# Patient Record
Sex: Male | Born: 1935 | Race: White | Hispanic: No | Marital: Married | Smoking: Former smoker
Health system: Southern US, Community
[De-identification: ages and names within clinical notes are randomized; demographics above are authoritative.]

## PROBLEM LIST (undated history)

## (undated) DIAGNOSIS — J449 Chronic obstructive pulmonary disease, unspecified: Secondary | ICD-10-CM

---

## 2015-03-19 ENCOUNTER — Emergency Department (HOSPITAL_COMMUNITY): Payer: Medicare Other

## 2015-03-19 ENCOUNTER — Inpatient Hospital Stay (HOSPITAL_COMMUNITY)
Admission: EM | Admit: 2015-03-19 | Discharge: 2015-03-20 | DRG: 193 | Disposition: A | Discharge status: Home / Self Care | Payer: Medicare Other | Attending: Internal Medicine | Admitting: Internal Medicine

## 2015-03-19 ENCOUNTER — Encounter (HOSPITAL_COMMUNITY): Payer: Self-pay | Admitting: Emergency Medicine

## 2015-03-19 ENCOUNTER — Inpatient Hospital Stay (HOSPITAL_COMMUNITY): Payer: Medicare Other

## 2015-03-19 ENCOUNTER — Other Ambulatory Visit (HOSPITAL_COMMUNITY): Payer: Self-pay

## 2015-03-19 DIAGNOSIS — J9811 Atelectasis: Secondary | ICD-10-CM | POA: Diagnosis present

## 2015-03-19 DIAGNOSIS — Z87891 Personal history of nicotine dependence: Secondary | ICD-10-CM | POA: Diagnosis not present

## 2015-03-19 DIAGNOSIS — J9601 Acute respiratory failure with hypoxia: Secondary | ICD-10-CM | POA: Diagnosis present

## 2015-03-19 DIAGNOSIS — J189 Pneumonia, unspecified organism: Secondary | ICD-10-CM | POA: Diagnosis present

## 2015-03-19 DIAGNOSIS — R1310 Dysphagia, unspecified: Secondary | ICD-10-CM | POA: Diagnosis present

## 2015-03-19 DIAGNOSIS — R0602 Shortness of breath: Secondary | ICD-10-CM | POA: Diagnosis not present

## 2015-03-19 DIAGNOSIS — J441 Chronic obstructive pulmonary disease with (acute) exacerbation: Secondary | ICD-10-CM

## 2015-03-19 HISTORY — DX: Chronic obstructive pulmonary disease, unspecified: J44.9

## 2015-03-19 LAB — CBC
HCT: 38.9 % — ABNORMAL LOW (ref 39.0–52.0)
Hemoglobin: 12.5 g/dL — ABNORMAL LOW (ref 13.0–17.0)
MCH: 28.1 pg (ref 26.0–34.0)
MCHC: 32.1 g/dL (ref 30.0–36.0)
MCV: 87.4 fL (ref 78.0–100.0)
Platelets: 238 10*3/uL (ref 150–400)
RBC: 4.45 MIL/uL (ref 4.22–5.81)
RDW: 14.8 % (ref 11.5–15.5)
WBC: 12.2 10*3/uL — ABNORMAL HIGH (ref 4.0–10.5)

## 2015-03-19 LAB — BASIC METABOLIC PANEL
Anion gap: 9 (ref 5–15)
BUN: 20 mg/dL (ref 6–20)
CALCIUM: 8.5 mg/dL — AB (ref 8.9–10.3)
CO2: 23 mmol/L (ref 22–32)
Chloride: 103 mmol/L (ref 101–111)
Creatinine, Ser: 0.96 mg/dL (ref 0.61–1.24)
GFR calc Af Amer: >60 mL/min (ref >60)
GLUCOSE: 130 mg/dL — AB (ref 65–99)
Potassium: 3.7 mmol/L (ref 3.5–5.1)
Sodium: 135 mmol/L (ref 135–145)

## 2015-03-19 LAB — EXPECTORATED SPUTUM ASSESSMENT W REFEX TO RESP CULTURE

## 2015-03-19 LAB — CBC WITH DIFFERENTIAL/PLATELET
Basophils Absolute: 0 10*3/uL (ref 0.0–0.1)
Basophils Relative: 0 % (ref 0–1)
EOS ABS: 0 10*3/uL (ref 0.0–0.7)
EOS PCT: 0 % (ref 0–5)
HEMATOCRIT: 38.1 % — AB (ref 39.0–52.0)
Hemoglobin: 12.3 g/dL — ABNORMAL LOW (ref 13.0–17.0)
LYMPHS ABS: 0.4 10*3/uL — AB (ref 0.7–4.0)
Lymphocytes Relative: 3 % — ABNORMAL LOW (ref 12–46)
MCH: 28.3 pg (ref 26.0–34.0)
MCHC: 32.3 g/dL (ref 30.0–36.0)
MCV: 87.6 fL (ref 78.0–100.0)
MONOS PCT: 3 % (ref 3–12)
Monocytes Absolute: 0.4 10*3/uL (ref 0.1–1.0)
Neutro Abs: 15.3 10*3/uL — ABNORMAL HIGH (ref 1.7–7.7)
Neutrophils Relative %: 94 % — ABNORMAL HIGH (ref 43–77)
Platelets: 231 10*3/uL (ref 150–400)
RBC: 4.35 MIL/uL (ref 4.22–5.81)
RDW: 14.8 % (ref 11.5–15.5)
WBC: 16.1 10*3/uL — ABNORMAL HIGH (ref 4.0–10.5)

## 2015-03-19 LAB — PROTIME-INR
INR: 1.13 (ref 0.00–1.49)
Prothrombin Time: 14.6 seconds (ref 11.6–15.2)

## 2015-03-19 LAB — I-STAT TROPONIN, ED: TROPONIN I, POC: 0 ng/mL (ref 0.00–0.08)

## 2015-03-19 LAB — STREP PNEUMONIAE URINARY ANTIGEN: Strep Pneumo Urinary Antigen: NEGATIVE

## 2015-03-19 LAB — APTT: APTT: 32 s (ref 24–37)

## 2015-03-19 MED ORDER — IPRATROPIUM-ALBUTEROL 0.5-2.5 (3) MG/3ML IN SOLN
3.0000 mL | Freq: Once | RESPIRATORY_TRACT | Status: AC
  Administered 2015-03-19: 3 mL via RESPIRATORY_TRACT
  Filled 2015-03-19: qty 3

## 2015-03-19 MED ORDER — METHYLPREDNISOLONE SODIUM SUCC 125 MG IJ SOLR
125.0000 mg | Freq: Once | INTRAMUSCULAR | Status: AC
  Administered 2015-03-19: 125 mg via INTRAVENOUS
  Filled 2015-03-19: qty 2

## 2015-03-19 MED ORDER — LEVOFLOXACIN IN D5W 750 MG/150ML IV SOLN
750.0000 mg | Freq: Once | INTRAVENOUS | Status: AC
Start: 2015-03-19 — End: 2015-03-19
  Administered 2015-03-19: 750 mg via INTRAVENOUS
  Filled 2015-03-19: qty 150

## 2015-03-19 MED ORDER — TERAZOSIN HCL 5 MG PO CAPS
5.0000 mg | ORAL_CAPSULE | Freq: Every day | ORAL | Status: DC
Start: 2015-03-19 — End: 2015-03-20
  Administered 2015-03-19: 5 mg via ORAL
  Filled 2015-03-19 (×3): qty 1

## 2015-03-19 MED ORDER — CETYLPYRIDINIUM CHLORIDE 0.05 % MT LIQD
7.0000 mL | Freq: Two times a day (BID) | OROMUCOSAL | Status: DC
  Administered 2015-03-19 – 2015-03-20 (×2): 7 mL via OROMUCOSAL

## 2015-03-19 MED ORDER — METHYLPREDNISOLONE SODIUM SUCC 125 MG IJ SOLR
60.0000 mg | Freq: Four times a day (QID) | INTRAMUSCULAR | Status: DC
  Administered 2015-03-19 – 2015-03-20 (×5): 60 mg via INTRAVENOUS
  Filled 2015-03-19 (×3): qty 0.96
  Filled 2015-03-19: qty 2
  Filled 2015-03-19 (×3): qty 0.96
  Filled 2015-03-19: qty 2
  Filled 2015-03-19: qty 0.96

## 2015-03-19 MED ORDER — FINASTERIDE 5 MG PO TABS
5.0000 mg | ORAL_TABLET | Freq: Every day | ORAL | Status: DC
  Administered 2015-03-19 – 2015-03-20 (×2): 5 mg via ORAL
  Filled 2015-03-19 (×3): qty 1

## 2015-03-19 MED ORDER — BENZONATATE 100 MG PO CAPS
100.0000 mg | ORAL_CAPSULE | Freq: Three times a day (TID) | ORAL | Status: DC
  Administered 2015-03-19 – 2015-03-20 (×4): 100 mg via ORAL
  Filled 2015-03-19 (×7): qty 1

## 2015-03-19 MED ORDER — POTASSIUM CHLORIDE CRYS ER 20 MEQ PO TBCR
40.0000 meq | EXTENDED_RELEASE_TABLET | Freq: Once | ORAL | Status: AC
  Administered 2015-03-19: 40 meq via ORAL
  Filled 2015-03-19: qty 2

## 2015-03-19 MED ORDER — SODIUM CHLORIDE 0.9 % IV SOLN
INTRAVENOUS | Status: DC
  Administered 2015-03-19 – 2015-03-20 (×3): via INTRAVENOUS

## 2015-03-19 MED ORDER — MOMETASONE FURO-FORMOTEROL FUM 100-5 MCG/ACT IN AERO
2.0000 | INHALATION_SPRAY | Freq: Two times a day (BID) | RESPIRATORY_TRACT | Status: DC
  Administered 2015-03-19 – 2015-03-20 (×2): 2 via RESPIRATORY_TRACT
  Filled 2015-03-19 (×3): qty 8.8

## 2015-03-19 MED ORDER — IPRATROPIUM-ALBUTEROL 0.5-2.5 (3) MG/3ML IN SOLN
3.0000 mL | RESPIRATORY_TRACT | Status: DC
  Administered 2015-03-19 – 2015-03-20 (×7): 3 mL via RESPIRATORY_TRACT
  Filled 2015-03-19 (×7): qty 3

## 2015-03-19 MED ORDER — ENOXAPARIN SODIUM 40 MG/0.4ML ~~LOC~~ SOLN
40.0000 mg | SUBCUTANEOUS | Status: DC
  Administered 2015-03-19 – 2015-03-20 (×2): 40 mg via SUBCUTANEOUS
  Filled 2015-03-19 (×3): qty 0.4

## 2015-03-19 MED ORDER — BRIMONIDINE TARTRATE 0.2 % OP SOLN
1.0000 [drp] | Freq: Two times a day (BID) | OPHTHALMIC | Status: DC
  Administered 2015-03-19 – 2015-03-20 (×3): 1 [drp] via OPHTHALMIC
  Filled 2015-03-19: qty 5

## 2015-03-19 MED ORDER — POLYETHYLENE GLYCOL 3350 17 G PO PACK
17.0000 g | PACK | Freq: Every day | ORAL | Status: DC
  Administered 2015-03-19 – 2015-03-20 (×2): 17 g via ORAL
  Filled 2015-03-19 (×2): qty 1

## 2015-03-19 MED ORDER — LATANOPROST 0.005 % OP SOLN
1.0000 [drp] | Freq: Every day | OPHTHALMIC | Status: DC
  Administered 2015-03-19: 1 [drp] via OPHTHALMIC
  Filled 2015-03-19: qty 2.5

## 2015-03-19 MED ORDER — DM-GUAIFENESIN ER 30-600 MG PO TB12
1.0000 | ORAL_TABLET | Freq: Once | ORAL | Status: AC
  Administered 2015-03-19: 1 via ORAL
  Filled 2015-03-19: qty 1

## 2015-03-19 MED ORDER — LEVOFLOXACIN IN D5W 750 MG/150ML IV SOLN
750.0000 mg | INTRAVENOUS | Status: DC
  Administered 2015-03-20: 750 mg via INTRAVENOUS
  Filled 2015-03-19 (×2): qty 150

## 2015-03-19 MED ORDER — GUAIFENESIN ER 600 MG PO TB12
600.0000 mg | ORAL_TABLET | Freq: Two times a day (BID) | ORAL | Status: DC
  Administered 2015-03-19 – 2015-03-20 (×3): 600 mg via ORAL
  Filled 2015-03-19 (×5): qty 1

## 2015-03-19 NOTE — Progress Notes (Signed)
[facility] PROGRESS NOTE   Marco AgeeRobert Marco [name] WUJ:[id number] DOB: Dec 31, 1929 DOA: 03/19/2015 PCP: No PCP Per Patient  HPI/Subjective: Denies fever and chills, has slight shortness of breath and sputum production.  Assessment/Plan: Principal Problem:   CAP (community acquired pneumonia) Active Problems:   COPD exacerbation   Acute respiratory failure with hypoxia   Swallowing difficulty   This is Marco [name] no charge note, patient seen earlier today by my colleague Dr. Allena KatzPatel. Patient admitted for community-acquired pneumonia with COPD exacerbation.  Started on antibiotics and steroids with other supportive respiratory treatments. History of dysphagia, SLP evaluated him and recommended regular diet with thin liquids.  Code Status: Full Code Family Communication: Plan discussed with the patient. Disposition Plan: Remains inpatient Diet: Diet regular Room service appropriate?: Yes; Fluid consistency:: Thin  Consultants:  None  Procedures:  None  Antibiotics:  Levofloxacin   Objective: Filed Vitals:   03/19/15 0525  BP: 124/54  Pulse: 105  Temp: 97.4 F (36.3 C)  Resp: 20    Intake/Output Summary (Last 24 hours) at 03/19/15 1231 Last data filed at 03/19/15 0952  Gross per 24 hour  Intake 151.25 ml  Output    800 ml  Net -648.75 ml   Filed Weights   03/19/15 0525  Weight: 86.728 kg (191 lb 3.2 oz)    Exam: General: Alert and awake, oriented x3, not in any acute distress. HEENT: anicteric sclera, pupils reactive to light and accommodation, EOMI CVS: S1-S2 clear, no murmur rubs or gallops Chest: clear to auscultation bilaterally, no wheezing, rales or rhonchi Abdomen: soft nontender, nondistended, normal bowel sounds, no organomegaly Extremities: no cyanosis, clubbing or edema noted bilaterally Neuro: Cranial nerves II-XII intact, no focal neurological deficits  Data Reviewed: Basic Metabolic Panel:  Recent Labs Lab 03/19/15 0148  NA 135  K 3.7  CL 103   CO2 23  GLUCOSE 130*  BUN 20  CREATININE 0.96  CALCIUM 8.5*   Liver Function Tests: No results for input(s): AST, ALT, ALKPHOS, BILITOT, PROT, ALBUMIN in the last 168 hours. No results for input(s): LIPASE, AMYLASE in the last 168 hours. No results for input(s): AMMONIA in the last 168 hours. CBC:  Recent Labs Lab 03/19/15 0148 03/19/15 0615  WBC 12.2* 16.1*  NEUTROABS  --  15.3*  HGB 12.5* 12.3*  HCT 38.9* 38.1*  MCV 87.4 87.6  PLT 238 231   Cardiac Enzymes: No results for input(s): CKTOTAL, CKMB, CKMBINDEX, TROPONINI in the last 168 hours. BNP (last 3 results) No results for input(s): BNP in the last 8760 hours.  ProBNP (last 3 results) No results for input(s): PROBNP in the last 8760 hours.  CBG: No results for input(s): GLUCAP in the last 168 hours.  Micro Recent Results (from the past 240 hour(s))  Culture, sputum-assessment     Status: None   Collection Time: 03/19/15  8:40 AM  Result Value Ref Range Status   Specimen Description SPUTUM  Final   Special Requests NONE  Final   Sputum evaluation   Final    THIS SPECIMEN IS ACCEPTABLE. RESPIRATORY CULTURE REPORT TO FOLLOW.   Report Status 03/19/2015 FINAL  Final     Studies: Dg Chest Port 1 View  03/19/2015   CLINICAL DATA:  Cough and shortness of breath for 3-4 weeks. Left-sided chest pain. History of COPD.  EXAM: PORTABLE CHEST - 1 VIEW  COMPARISON:  None.  FINDINGS: Heart size and mediastinal contours are normal. Ill-defined left perihilar opacity, there are calcified left pleural band diaphragmatic plaques. The  lungs are hyperinflated. Bibasilar atelectasis or scarring. No large pleural effusion or pneumothorax. Degenerative change of both shoulders.  IMPRESSION: 1. Ill-defined left perihilar opacity. This may be chronic given the calcified pleural plaques, however there are no exams available for comparison and pneumonia could have Marco [name] similar appearance. Followup PA and lateral chest X-ray is recommended in  3-4 weeks following trial of antibiotic therapy to ensure resolution and exclude underlying malignancy. 2. Hyperinflation and bibasilar atelectasis or scarring.   Electronically Signed   By: Rubye OaksMelanie  Ehinger M.D.   On: 03/19/2015 02:44    Scheduled Meds: . antiseptic oral rinse  7 mL Mouth Rinse BID  . benzonatate  100 mg Oral TID  . brimonidine  1 drop Right Eye BID  . enoxaparin (LOVENOX) injection  40 mg Subcutaneous Q24H  . finasteride  5 mg Oral Daily  . guaiFENesin  600 mg Oral BID  . ipratropium-albuterol  3 mL Nebulization Q4H  . latanoprost  1 drop Both Eyes QHS  . levofloxacin (LEVAQUIN) IV  750 mg Intravenous Q24H  . methylPREDNISolone (SOLU-MEDROL) injection  60 mg Intravenous Q6H  . mometasone-formoterol  2 puff Inhalation BID  . terazosin  5 mg Oral QHS   Continuous Infusions: . sodium chloride 75 mL/hr at 03/19/15 0526       Time spent: 35 minutes    [facility] Northern KentuckyELMAHI,Marco Marco [name]  [facility] Pager [phone] If 7PM-7AM, please contact night-coverage at [email or web address], password [email or web address] 03/19/2015, 12:31 PM  LOS: 0 days

## 2015-03-19 NOTE — ED Provider Notes (Signed)
CSN: [id number]     Arrival date & time 03/19/15  0131 History  This chart was scribed for Marisa Severinlga Vinie Charity, MD by Elveria Risingimelie Horne, ED scribe.  This patient was seen in room B17C/B17C and the patient's care was started at 1:53 AM.   Chief Complaint  Patient presents with  . Shortness of Breath    The history is provided by the patient. No language interpreter was used.   HPI Comments: Marco [name] is a 81 y.o. male with PMHx of COPD who presents to the Emergency Department complaining of shortness of breath for several weeks that worsened today. Patient reports chest pain with inspiration.  Patient reports that he was seen at his pulmonology office a few weeks ago due to cough and shortness of breath.  Nurse practitioner at that time thought it was due to allergy and has been treating him with allergy medication.  Patient is currently in town for a wedding and reports that symptoms worsened.  He has been using his nebulizer machine and his inhalers without improvement.  He denies any fever or chills.  Patient arrives hypoxic, does not normally wear oxygen.  [place]  Past Medical History  Diagnosis Date  . COPD (chronic obstructive pulmonary disease)    No past surgical history on file. History reviewed. No pertinent family history. History  Substance Use Topics  . Smoking status: Former Games developermoker  . Smokeless tobacco: Not on file  . Alcohol Use: Not on file    Review of Systems  Constitutional: Negative for fever and chills.  HENT: Negative for congestion, rhinorrhea and sore throat.   Eyes: Negative for visual disturbance.  Respiratory: Positive for shortness of breath. Negative for cough and wheezing.   Cardiovascular: Positive for chest pain.  Gastrointestinal: Negative for nausea, vomiting, abdominal pain and diarrhea.  Endocrine: Negative for polyuria.  Genitourinary: Negative for dysuria.  Musculoskeletal: Negative for back pain.  Skin:  Negative for rash.  Allergic/Immunologic: Negative for immunocompromised state.  Neurological: Negative for headaches.  Hematological: Does not bruise/bleed easily.  Psychiatric/Behavioral: Negative for confusion.      Allergies  Review of patient's allergies indicates no known allergies.  Home Medications   Prior to Admission medications   Not on File   BP 142/71 mmHg  Pulse 115  Resp 26  SpO2 96% Physical Exam  Constitutional: He is oriented to person, place, and time. He appears well-developed and well-nourished.  HENT:  Head: Normocephalic and atraumatic.  Nose: Nose normal.  Mouth/Throat: Oropharynx is clear and moist.  Eyes: Conjunctivae and EOM are normal. Pupils are equal, round, and reactive to light.  Neck: Normal range of motion. Neck supple. No JVD present. No tracheal deviation present. No thyromegaly present.  Cardiovascular: Normal rate, regular rhythm, normal heart sounds and intact distal pulses.  Exam reveals no gallop and no friction rub.   No murmur heard. Pulmonary/Chest: Effort normal. No stridor. No respiratory distress. He has wheezes. He has no rales. He exhibits no tenderness.  Abdominal: Soft. Bowel sounds are normal. He exhibits no distension and no mass. There is no tenderness. There is no rebound and no guarding.  Musculoskeletal: Normal range of motion. He exhibits no edema or tenderness.  Lymphadenopathy:    He has no cervical adenopathy.  Neurological: He is alert and oriented to person, place, and time. He displays normal reflexes. He exhibits normal muscle tone. Coordination normal.  Skin: Skin is warm and dry. No rash  noted. No erythema. No pallor.  Psychiatric: He has a normal mood and affect. His behavior is normal. Judgment and thought content normal.  Nursing note and vitals reviewed.   ED Course  Procedures (including critical care time)  COORDINATION OF CARE: 1:53 AM- Discussed treatment plan with patient at bedside and patient  agreed to plan.   Labs Review Labs Reviewed  CBC - Abnormal; Notable for the following:    WBC 12.2 (*)    Hemoglobin 12.5 (*)    HCT 38.9 (*)    All other components within normal limits  BASIC METABOLIC PANEL - Abnormal; Notable for the following:    Glucose, Bld 130 (*)    Calcium 8.5 (*)    All other components within normal limits  CBC WITH DIFFERENTIAL/PLATELET - Abnormal; Notable for the following:    WBC 16.1 (*)    Hemoglobin 12.3 (*)    HCT 38.1 (*)    Neutrophils Relative % 94 (*)    Neutro Abs 15.3 (*)    Lymphocytes Relative 3 (*)    Lymphs Abs 0.4 (*)    All other components within normal limits  CULTURE, BLOOD (ROUTINE X 2)  CULTURE, BLOOD (ROUTINE X 2)  CULTURE, EXPECTORATED SPUTUM-ASSESSMENT  GRAM STAIN  PROTIME-INR  APTT  LEGIONELLA ANTIGEN, URINE  STREP PNEUMONIAE URINARY ANTIGEN  I-STAT TROPOININ, ED    Imaging Review Dg Chest Port 1 View  03/19/2015   CLINICAL DATA:  Cough and shortness of breath for 3-4 weeks. Left-sided chest pain. History of COPD.  EXAM: PORTABLE CHEST - 1 VIEW  COMPARISON:  None.  FINDINGS: Heart size and mediastinal contours are normal. Ill-defined left perihilar opacity, there are calcified left pleural band diaphragmatic plaques. The lungs are hyperinflated. Bibasilar atelectasis or scarring. No large pleural effusion or pneumothorax. Degenerative change of both shoulders.  IMPRESSION: 1. Ill-defined left perihilar opacity. This may be chronic given the calcified pleural plaques, however there are no exams available for comparison and pneumonia could have a similar appearance. Followup PA and lateral chest X-ray is recommended in 3-4 weeks following trial of antibiotic therapy to ensure resolution and exclude underlying malignancy. 2. Hyperinflation and bibasilar atelectasis or scarring.   Electronically Signed   By: Rubye OaksMelanie  Ehinger M.D.   On: 03/19/2015 02:44     EKG Interpretation None      MDM   Final diagnoses:  COPD  exacerbation  CAP (community acquired pneumonia)    81 year old male with COPD who presents hypoxic and wheezing.  Patient is anxious to fly out tomorrow, but as I have told him I am uncomfortable if he is still hypoxic with discharged from the hospital.  Patient to receive second DuoNeb, chest x-ray is pending.  If improved aeration will ambulate in the hall and check sats.  I personally performed the services described in this documentation, which was scribed in my presence. The recorded information has been reviewed and is accurate.    3:32 AM Pt at rest 95% on room air.  With ambulation, quickly desats to 85%.  Given baseline COPD and probable pneumonia, will admit.  Marisa Severinlga Latima Hamza, MD 03/19/15 [phone]

## 2015-03-19 NOTE — ED Notes (Signed)
Patient with shortness of breath for the last few weeks, increasing today.  Patient was 85% on room air upon arrival to ED.  Patient also admits to chest pain when he breaths in.  Patient is CAOx4.

## 2015-03-19 NOTE — Procedures (Signed)
Objective Swallowing Evaluation: Other (Comment) (MBS)  Patient Details  Name: Marco [name] MRN: [id number] Date of Birth: 01/20/1930  Today's Date: 03/19/2015 Time: SLP Start Time (ACUTE ONLY): 1320-SLP Stop Time (ACUTE ONLY): 1350 SLP Time Calculation (min) (ACUTE ONLY): 30 min  Past Medical History:  Past Medical History  Diagnosis Date  . COPD (chronic obstructive pulmonary disease)    Past Surgical History: History reviewed. No pertinent past surgical history. HPI:  Other Pertinent Information: Marco [name] is a 81 y.o. male with Past medical history of COPD, swallowing difficulty.  No Data Recorded  Assessment / Plan / Recommendation CHL IP CLINICAL IMPRESSIONS 03/19/2015  Therapy Diagnosis Suspected primary esophageal dysphagia  Clinical Impression Pt demosntrates oral and ororphayngeal function WNL for age with no aspiration of any consistency tested. Pt did have several instances of high flash penetration (WFL for age) during the swallow when taking consecutive sips, deeper when taking barium tablet with water. Pt did have hard coughing throughout test despite clear airway, possibly in response to appearance of distal esophageal stasis, particularly with solid trials (no radiologist present to confirm). Pt recommended to follow basic aspiration and esophageal precautions. No diet modifications needed though further assessment of esophageal function may be beneficial (unclear whether pt was scheduled for MBS or esophagram in [place]).       CHL IP TREATMENT RECOMMENDATION 03/19/2015  Treatment Recommendations No treatment recommended at this time     CHL IP DIET RECOMMENDATION 03/19/2015  SLP Diet Recommendations (No Data)  Liquid Administration via (None)  Medication Administration Whole meds with puree  Compensations Follow solids with liquid;Slow rate;Small sips/bites  Postural Changes and/or Swallow Maneuvers (None)     CHL IP OTHER RECOMMENDATIONS 03/19/2015  Recommended  Consults Consider esophageal assessment  Oral Care Recommendations Patient independent with oral care  Other Recommendations (None)     No flowsheet data found.   No flowsheet data found.   Pertinent Vitals/Pain NA    SLP Swallow Goals No flowsheet data found.  No flowsheet data found.    CHL IP REASON FOR REFERRAL 03/19/2015  Reason for Referral Objectively evaluate swallowing function     CHL IP ORAL PHASE 03/19/2015  Lips (None)  Tongue (None)  Mucous membranes (None)  Nutritional status (None)  Other (None)  Oxygen therapy (None)  Oral Phase WFL  Oral - Pudding Teaspoon (None)  Oral - Pudding Cup (None)  Oral - Honey Teaspoon (None)  Oral - Honey Cup (None)  Oral - Honey Syringe (None)  Oral - Nectar Teaspoon (None)  Oral - Nectar Cup (None)  Oral - Nectar Straw (None)  Oral - Nectar Syringe (None)  Oral - Ice Chips (None)  Oral - Thin Teaspoon (None)  Oral - Thin Cup (None)  Oral - Thin Straw (None)  Oral - Thin Syringe (None)  Oral - Puree (None)  Oral - Mechanical Soft (None)  Oral - Regular (None)  Oral - Multi-consistency (None)  Oral - Pill (None)  Oral Phase - Comment (None)      CHL IP PHARYNGEAL PHASE 03/19/2015  Pharyngeal Phase WFL  Pharyngeal - Pudding Teaspoon (None)  Penetration/Aspiration details (pudding teaspoon) (None)  Pharyngeal - Pudding Cup (None)  Penetration/Aspiration details (pudding cup) (None)  Pharyngeal - Honey Teaspoon (None)  Penetration/Aspiration details (honey teaspoon) (None)  Pharyngeal - Honey Cup (None)  Penetration/Aspiration details (honey cup) (None)  Pharyngeal - Honey Syringe (None)  Penetration/Aspiration details (honey syringe) (None)  Pharyngeal - Nectar Teaspoon (None)  Penetration/Aspiration details (nectar teaspoon) (None)  Pharyngeal - Nectar Cup (None)  Penetration/Aspiration details (nectar cup) (None)  Pharyngeal - Nectar Straw (None)  Penetration/Aspiration details (nectar straw) (None)   Pharyngeal - Nectar Syringe (None)  Penetration/Aspiration details (nectar syringe) (None)  Pharyngeal - Ice Chips (None)  Penetration/Aspiration details (ice chips) (None)  Pharyngeal - Thin Teaspoon (None)  Penetration/Aspiration details (thin teaspoon) (None)  Pharyngeal - Thin Cup (None)  Penetration/Aspiration details (thin cup) (None)  Pharyngeal - Thin Straw (None)  Penetration/Aspiration details (thin straw) (None)  Pharyngeal - Thin Syringe (None)  Penetration/Aspiration details (thin syringe') (None)  Pharyngeal - Puree (None)  Penetration/Aspiration details (puree) (None)  Pharyngeal - Mechanical Soft (None)  Penetration/Aspiration details (mechanical soft) (None)  Pharyngeal - Regular (None)  Penetration/Aspiration details (regular) (None)  Pharyngeal - Multi-consistency (None)  Penetration/Aspiration details (multi-consistency) (None)  Pharyngeal - Pill (None)  Penetration/Aspiration details (pill) (None)  Pharyngeal Comment (None)      CHL IP CERVICAL ESOPHAGEAL PHASE 03/19/2015  Cervical Esophageal Phase WFL  Pudding Teaspoon (None)  Pudding Cup (None)  Honey Teaspoon (None)  Honey Cup (None)  Honey Straw (None)  Nectar Teaspoon (None)  Nectar Cup (None)  Nectar Straw (None)  Nectar Sippy Cup (None)  Thin Teaspoon (None)  Thin Cup (None)  Thin Straw (None)  Thin Sippy Cup (None)  Cervical Esophageal Comment (None)    No flowsheet data found.        Harlon DittyBonnie Surina Storts, MA CCC-SLP [phone]  Claudine MoutonDeBlois, Cassanda Walmer Caroline 03/19/2015, 2:05 PM

## 2015-03-19 NOTE — H&P (Signed)
[facility] History and Physical  Patient: Marco [name]  MRN: [id number]  DOB: [birth year]  DOS: the patient was seen and examined on 03/19/2015 PCP: No PCP Per Patient  Referring physician: Dr. Norlene Campbellotter Chief Complaint: Shortness of breath  HPI: Marco [name] is a 81 y.o. male with Past medical history of COPD, swallowing difficulty. The patient is presenting with complaints of progressively worsening shortness of breath with cough. Patient mentions that past winter he has 2 episodes of "bad" bronchitis for which she was treated with Prednisone and antibiotics. For his past episode 3 weeks ago he was seen by his pulmonology nurse practitioner who thought that the patient has episodes of allergies and started workup for allergy and placed him on medications for allergy. Since last 3 weeks he has been having progressively worsening shortness of breath and not improving cough. The [place] with his family and since last 2 days his breathing was worsening and it was not improving with nebulizers. His cough was also worsening and he felt that he was choking get air. He mentions that he had also has been diagnosed with swallowing difficulty has been seen by the GI attending with an endoscopy about any significant finding. He is scheduled to see an ENT as well as a get his swallowing evaluated. He denies any fever or chills. He has yellowish expectoration with nausea. Occasional chest pain associated with cough. Denies any abdominal pain denies any diarrhea or constipation. He'll complains of occasional dizziness. He has never been on oxygen. Denies any recent hospitalization.   The patient is coming from home And at his baseline independent for most of his ADL.  Review of Systems: as mentioned in the history of present illness.  A comprehensive review of the other systems is negative.  Past Medical History  Diagnosis Date  . COPD (chronic obstructive  pulmonary disease)    History reviewed. No pertinent past surgical history. Social History:  reports that he has quit smoking. He does not have any smokeless tobacco history on file. He reports that he drinks about 1.2 oz of alcohol per week. He reports that he does not use illicit drugs.  No Known Allergies  Family History  Problem Relation Age of Onset  . Cancer Father     Prior to Admission medications   Medication Sig Start Date End Date Taking? Authorizing Provider  bimatoprost (LUMIGAN) 0.01 % SOLN Place 1 drop into both eyes at bedtime.   Yes Historical Provider, MD  brimonidine (ALPHAGAN) 0.2 % ophthalmic solution Place 1 drop into the right eye 2 (two) times daily.  01/26/15  Yes Historical Provider, MD  BRIMONIDINE TARTRATE OP Apply to eye. Unsure of dosage   Yes Historical Provider, MD  carbamazepine (TEGRETOL) 200 MG tablet Take 200 mg by mouth as needed (for leg cramps).   Yes Historical Provider, MD  finasteride (PROSCAR) 5 MG tablet Take 5 mg by mouth daily.   Yes Historical Provider, MD  Fluticasone-Salmeterol (ADVAIR) 250-50 MCG/DOSE AEPB Inhale 1 puff into the lungs 2 (two) times daily.   Yes Historical Provider, MD  terazosin (HYTRIN) 5 MG capsule Take 5 mg by mouth at bedtime.   Yes Historical Provider, MD  tiotropium (SPIRIVA) 18 MCG inhalation capsule Place 18 mcg into inhaler and inhale daily.   Yes Historical Provider, MD    Physical Exam: Filed Vitals:   03/19/15 0400 03/19/15 0430 03/19/15 0439 03/19/15 0525  BP: 161/71 149/77  124/54  Pulse: 108  107  105  Temp:   98.3 F (36.8 C) 97.4 F (36.3 C)  TempSrc:   Oral Oral  Resp: Height:     (1.753 m)  Weight:    86.728 kg (191 lb 3.2 oz)  SpO2: 96% 94%  97%    General: Alert, Awake and Oriented to Time, Place and Person. Appear in marked distress Eyes: PERRL ENT: Oral Mucosa clear moist. Neck: no JVD Cardiovascular: S1 and S2 Present, no Murmur, Peripheral Pulses Present Respiratory:  Bilateral Air entry equal and Decreased,  Extensive bilateral rhonchi and left-sided Crackles, extensive bilateral expiratory wheezes Abdomen: Bowel Sound present, Soft and no tender Skin: no Rash Extremities: no Pedal edema, no calf tenderness Neurologic: Grossly no focal neuro deficit.  Labs on Admission:  CBC:  Recent Labs Lab 03/19/15 0148  WBC 12.2*  HGB 12.5*  HCT 38.9*  MCV 87.4  PLT 238    CMP     Component Value Date/Time   NA 135 03/19/2015 0148   K 3.7 03/19/2015 0148   CL 103 03/19/2015 0148   CO2 23 03/19/2015 0148   GLUCOSE 130* 03/19/2015 0148   BUN 20 03/19/2015 0148   CREATININE 0.96 03/19/2015 0148   CALCIUM 8.5* 03/19/2015 0148   GFRNONAA >60 03/19/2015 0148   GFRAA >60 03/19/2015 0148    No results for input(s): LIPASE, AMYLASE in the last 168 hours.  No results for input(s): CKTOTAL, CKMB, CKMBINDEX, TROPONINI in the last 168 hours. BNP (last 3 results) No results for input(s): BNP in the last 8760 hours.  ProBNP (last 3 results) No results for input(s): PROBNP in the last 8760 hours.   Radiological Exams on Admission: Dg Chest Port 1 View  03/19/2015   CLINICAL DATA:  Cough and shortness of breath for 3-4 weeks. Left-sided chest pain. History of COPD.  EXAM: PORTABLE CHEST - 1 VIEW  COMPARISON:  None.  FINDINGS: Heart size and mediastinal contours are normal. Ill-defined left perihilar opacity, there are calcified left pleural band diaphragmatic plaques. The lungs are hyperinflated. Bibasilar atelectasis or scarring. No large pleural effusion or pneumothorax. Degenerative change of both shoulders.  IMPRESSION: 1. Ill-defined left perihilar opacity. This may be chronic given the calcified pleural plaques, however there are no exams available for comparison and pneumonia could have a similar appearance. Followup PA and lateral chest X-ray is recommended in 3-4 weeks following trial of antibiotic therapy to ensure resolution and exclude underlying  malignancy. 2. Hyperinflation and bibasilar atelectasis or scarring.   Electronically Signed   By: Rubye Oaks M.D.   On: 03/19/2015 02:44   Assessment/Plan Principal Problem:   CAP (community acquired pneumonia) Active Problems:   COPD exacerbation   Acute respiratory failure with hypoxia   Swallowing difficulty   1. CAP (community acquired pneumonia) The patient is presenting with complaints of cough and shortness of breath. Chest x-ray shows left-sided pneumonia. Patient also has evidence of COPD flareup. he has expiratory wheezing, respiratory distress, tachypnea, with use of accessory muscles of respiration, hypoxia.  Patient will be admitted for community-acquired pneumonia and COPD exacerbation secondary to pneumonia. Follow urine antigens as well as sputum culture as well as blood cultures.  I will treat the patient with IV Solu-Medrol 60 mg Q 6hours, DUONEB every 4 hours, oxygen as needed, IV antibiotics levofloxacin per pharmacy. Mucinex as needed. Flutter device for sputum clearance with Mucinex    2. swallowing difficulty. Patient remains nothing by mouth for concern for aspiration.  Speech therapy evaluation.  Advance goals of care discussion: full code   DVT Prophylaxis: subcutaneous Heparin Nutrition: npo  Disposition: Admitted as inpatient, telemetry unit.  Author: Lynden OxfordPranav Reshunda Strider, MD [facility] Pager: [phone] 03/19/2015  If 7PM-7AM, please contact night-coverage [email or web address] Password [email or web address]

## 2015-03-19 NOTE — Evaluation (Signed)
Clinical/Bedside Swallow Evaluation Patient Details  Name: Marco [name] MRN: [id number] Date of Birth: [birth year]  Today's Date: 03/19/2015 Time: SLP Start Time (ACUTE ONLY): 1100 SLP Stop Time (ACUTE ONLY): 1115 SLP Time Calculation (min) (ACUTE ONLY): 15 min  Past Medical History:  Past Medical History  Diagnosis Date  . COPD (chronic obstructive pulmonary disease)    Past Surgical History: History reviewed. No pertinent past surgical history. HPI:  Marco [name] is a 81 y.o. male with Past medical history of COPD, swallowing difficulty.   Assessment / Plan / Recommendation Clinical Impression  Pt reports a history of globus and expectoration of foods and two instances of pills blocking airway. During subjective observation pt with a baseline cough and delayed cough following PO intake, but no immediate signs of aspiration. Pt avoids certain foods and is sure to chop meats very finely. Given presentation, recommend objective testing during this acute stay to determine if dysphagia is impacting respiratory function and to provide results to MD in [place], as test was planned for today and pt has missed the appt. In the meantime, pt may initiate a regular diet and thin liquids following basic precautions.     Aspiration Risk  Moderate    Diet Recommendation  (regular/thin)   Medication Administration: Whole meds with liquid    Other  Recommendations Oral Care Recommendations: Patient independent with oral care   Follow Up Recommendations       Frequency and Duration        Pertinent Vitals/Pain NA    SLP Swallow Goals     Swallow Study Prior Functional Status       General Other Pertinent Information: Marco [name] is a 81 y.o. male with Past medical history of COPD, swallowing difficulty. Type of Study: Bedside swallow evaluation Previous Swallow Assessment: Endoscopy with GI, MBS? pending in [place], planned for today.  Diet Prior to this Study: NPO Temperature Spikes Noted:  No Respiratory Status: Supplemental O2 delivered via (comment) History of Recent Intubation: No Behavior/Cognition: Alert;Cooperative;Pleasant mood Oral Cavity - Dentition: Adequate natural dentition/normal for age Self-Feeding Abilities: Able to feed self Patient Positioning:  (edge of bed) Baseline Vocal Quality: Normal Volitional Cough: Strong Volitional Swallow: Able to elicit    Oral/Motor/Sensory Function Overall Oral Motor/Sensory Function: Appears within functional limits for tasks assessed   Ice Chips     Thin Liquid Thin Liquid: Within functional limits    Nectar Thick Nectar Thick Liquid: Not tested   Honey Thick Honey Thick Liquid: Not tested   Puree Puree: Within functional limits   Solid   GO    Solid: Within functional limits      [facility] Center LLCBonnie Jamilla Galli, MA CCC-SLP [phone]  Claudine MoutonDeBlois, Brittie Whisnant Caroline 03/19/2015,11:33 AM

## 2015-03-19 NOTE — Progress Notes (Signed)
Pt arrived to floor in NAD, A/Ox4, VSS. Pt short of breath on exertion. Oriented to room and floor, call bell within reach.

## 2015-03-19 NOTE — Progress Notes (Signed)
SATURATION QUALIFICATIONS: (This note is used to comply with regulatory documentation for home oxygen)  Patient Saturations on Room Air at Rest = 97%  Patient Saturations on Room Air while Ambulating = 92%  Pt ambulated from room to nursing station x2 on room air. O2 sat did not drop below 92, HR up to 111.

## 2015-03-19 NOTE — ED Notes (Signed)
Ambulated in hall on pulse ox per md request.  O2 sat dropped to 88%.  Dr Norlene Campbelltter aware.

## 2015-03-19 NOTE — Care Management Note (Signed)
Case Management Note  Patient Details  Name: Marco [name] MRN: [id number] Date of Birth: Mar 22, 1930  Subjective/Objective:        Pt admitted on 03/19/15 with PNA, COPD exacerbation.  PTA, pt independent, lives in [place].  [place].             Action/Plan: Will follow for discharge needs as pt progresses.    Expected Discharge Date:                  Expected Discharge Plan:  Home/Self Care  In-House Referral:     Discharge planning Services  CM Consult  Post Acute Care Choice:    Choice offered to:     DME Arranged:    DME Agency:     HH Arranged:    HH Agency:     Status of Service:  In process, will continue to follow  Medicare Important Message Given:    Date Medicare IM Given:    Medicare IM give by:    Date Additional Medicare IM Given:    Additional Medicare Important Message give by:     If discussed at Long Length of Stay Meetings, dates discussed:    Additional Comments:  Glennon Macmerson, Matika Bartell M, RN 03/19/2015, 2:56 PM Phone #[phone]

## 2015-03-20 LAB — BASIC METABOLIC PANEL
Anion gap: 8 (ref 5–15)
BUN: 19 mg/dL (ref 6–20)
CALCIUM: 8.6 mg/dL — AB (ref 8.9–10.3)
CHLORIDE: 109 mmol/L (ref 101–111)
CO2: 25 mmol/L (ref 22–32)
Creatinine, Ser: 0.92 mg/dL (ref 0.61–1.24)
GFR calc Af Amer: >60 mL/min (ref >60)
GFR calc non Af Amer: >60 mL/min (ref >60)
Glucose, Bld: 148 mg/dL — ABNORMAL HIGH (ref 65–99)
Potassium: 4.4 mmol/L (ref 3.5–5.1)
SODIUM: 142 mmol/L (ref 135–145)

## 2015-03-20 LAB — CBC
HCT: 35.8 % — ABNORMAL LOW (ref 39.0–52.0)
Hemoglobin: 11.3 g/dL — ABNORMAL LOW (ref 13.0–17.0)
MCH: 28 pg (ref 26.0–34.0)
MCHC: 31.6 g/dL (ref 30.0–36.0)
MCV: 88.6 fL (ref 78.0–100.0)
Platelets: 230 10*3/uL (ref 150–400)
RBC: 4.04 MIL/uL — ABNORMAL LOW (ref 4.22–5.81)
RDW: 15.2 % (ref 11.5–15.5)
WBC: 13.1 10*3/uL — ABNORMAL HIGH (ref 4.0–10.5)

## 2015-03-20 LAB — LEGIONELLA ANTIGEN, URINE

## 2015-03-20 MED ORDER — PREDNISONE 10 MG (21) PO TBPK: ORAL_TABLET | ORAL | Status: AC

## 2015-03-20 MED ORDER — GUAIFENESIN ER 600 MG PO TB12: 1200.0000 mg | ORAL_TABLET | Freq: Two times a day (BID) | ORAL | Status: AC

## 2015-03-20 MED ORDER — IPRATROPIUM-ALBUTEROL 0.5-2.5 (3) MG/3ML IN SOLN: 3.0000 mL | Freq: Three times a day (TID) | RESPIRATORY_TRACT | Status: DC

## 2015-03-20 MED ORDER — LEVOFLOXACIN 750 MG PO TABS: 750.0000 mg | ORAL_TABLET | Freq: Every day | ORAL | Status: AC

## 2015-03-20 MED ORDER — LEVOFLOXACIN 750 MG PO TABS: 750.0000 mg | ORAL_TABLET | Freq: Every day | ORAL | Status: DC

## 2015-03-20 NOTE — Progress Notes (Signed)
Pt is being discharged home. Pt has been provided with discharge instructions. RN answered all questions the patient had. Pt is being transported home by his family  

## 2015-03-20 NOTE — Discharge Summary (Signed)
Physician Discharge Summary  Marco AgeeRobert [name] ZOX:[id number] DOB: 08/05/1930 DOA: 03/19/2015  PCP: No PCP Per Patient  Admit date: 03/19/2015 Discharge date: 03/20/2015  Time spent: 40 minutes  Recommendations for Outpatient Follow-up:  1. Follow-up with primary care physician in one week. 2. Patient family was rushing the discharge to travel back to [place].  Discharge Diagnoses:  Principal Problem:   CAP (community acquired pneumonia) Active Problems:   COPD exacerbation   Acute respiratory failure with hypoxia   Swallowing difficulty   Discharge Condition: Stable  Diet recommendation: Heart healthy  Filed Weights   03/19/15 0525 03/20/15 0628  Weight: 86.728 kg (191 lb 3.2 oz) 87.726 kg (193 lb 6.4 oz)    History of present illness:  Marco AgeeRobert [name] is a 81 y.o. male with Past medical history of COPD, swallowing difficulty. The patient is presenting with complaints of progressively worsening shortness of breath with cough. Patient mentions that past winter he has 2 episodes of "bad" bronchitis for which she was treated with Prednisone and antibiotics. For his past episode 3 weeks ago he was seen by his pulmonology nurse practitioner who thought that the patient has episodes of allergies and started workup for allergy and placed him on medications for allergy. Since last 3 weeks he has been having progressively worsening shortness of breath and not improving cough. The patient was down in [place] for a wedding with his family and since last 2 days his breathing was worsening and it was not improving with nebulizers. His cough was also worsening and he felt that he was choking get air. He mentions that he had also has been diagnosed with swallowing difficulty has been seen by the GI attending with an endoscopy about any significant finding. He is scheduled to see an ENT as well as a get his swallowing evaluated. He denies any fever or chills. He has yellowish expectoration with  nausea. Occasional chest pain associated with cough. Denies any abdominal pain denies any diarrhea or constipation. He'll complains of occasional dizziness. He has never been on oxygen. Denies any recent hospitalization.  Hospital Course:   Community-acquired pneumonia Patient presented to the hospital with fever, shortness of breath, sputum production and x-ray findings of left perihilar opacity. Patient is started on levofloxacin and supportive management. Supportive management includes bronchodilators, mucolytics, antitussives and oxygen as needed. Streptococcus antigen is negative and his urine. Blood and sputum cultures are pending. Patient's family wants him to be discharged today, the traveling back to [place]. Discharged on levofloxacin 750 mg, prednisone taper and Mucinex.   Acute hypoxic respiratory failure Patient presented with oxygen saturation of 85% on room air, likely secondary to community-acquired pneumonia. After pneumonia treatment started weaned off of oxygen. On day of discharge his sats 97% on room air, patient's saturation 92% while ambulating on room air.  Dysphagia Patient complaining about difficulty while swallowing pills and some other certain foods. Seen by SLP and recommended regular diet with thin liquids. Modified barium swallow study was done and showed no evidence of aspiration. Patient reports that he's been worked up by his primary care physician for this, follow-up with PCP.  Procedures:  None  Consultations:  None  Discharge Exam: Filed Vitals:   03/20/15 0628  BP: 123/68  Pulse: 86  Temp:   Resp: 20   General: Alert and awake, oriented x3, not in any acute distress. HEENT: anicteric sclera, pupils reactive to light and accommodation, EOMI CVS: S1-S2 clear, no murmur rubs or gallops Chest: clear to auscultation  bilaterally, no wheezing, rales or rhonchi Abdomen: soft nontender, nondistended, normal bowel sounds, no  organomegaly Extremities: no cyanosis, clubbing or edema noted bilaterally Neuro: Cranial nerves II-XII intact, no focal neurological deficits  Discharge Instructions   Discharge Instructions    Diet - low sodium heart healthy    Complete by:  As directed      Increase activity slowly    Complete by:  As directed           Current Discharge Medication List    START taking these medications   Details  guaiFENesin (MUCINEX) 600 MG 12 hr tablet Take 2 tablets (1,200 mg total) by mouth 2 (two) times daily. Qty: 30 tablet, Refills: 30    levofloxacin (LEVAQUIN) 750 MG tablet Take 1 tablet (750 mg total) by mouth daily. Qty: 5 tablet, Refills: 0    predniSONE (STERAPRED UNI-PAK 21 TAB) 10 MG (21) TBPK tablet Take 6-5-4-3-2-1 PO daily till gone Qty: 21 tablet, Refills: 0      CONTINUE these medications which have NOT CHANGED   Details  bimatoprost (LUMIGAN) 0.01 % SOLN Place 1 drop into both eyes at bedtime.    !! brimonidine (ALPHAGAN) 0.2 % ophthalmic solution Place 1 drop into the right eye 2 (two) times daily.  Refills: 3    !! BRIMONIDINE TARTRATE OP Apply to eye. Unsure of dosage    carbamazepine (TEGRETOL) 200 MG tablet Take 200 mg by mouth as needed (for leg cramps).    finasteride (PROSCAR) 5 MG tablet Take 5 mg by mouth daily.    Fluticasone-Salmeterol (ADVAIR) 250-50 MCG/DOSE AEPB Inhale 1 puff into the lungs 2 (two) times daily.    terazosin (HYTRIN) 5 MG capsule Take 5 mg by mouth at bedtime.    tiotropium (SPIRIVA) 18 MCG inhalation capsule Place 18 mcg into inhaler and inhale daily.     !! - Potential duplicate medications found. Please discuss with provider.     No Known Allergies    The results of significant diagnostics from this hospitalization (including imaging, microbiology, ancillary and laboratory) are listed below for reference.    Significant Diagnostic Studies: Dg Chest Port 1 View  03/19/2015   CLINICAL DATA:  Cough and shortness of  breath for 3-4 weeks. Left-sided chest pain. History of COPD.  EXAM: PORTABLE CHEST - 1 VIEW  COMPARISON:  None.  FINDINGS: Heart size and mediastinal contours are normal. Ill-defined left perihilar opacity, there are calcified left pleural band diaphragmatic plaques. The lungs are hyperinflated. Bibasilar atelectasis or scarring. No large pleural effusion or pneumothorax. Degenerative change of both shoulders.  IMPRESSION: 1. Ill-defined left perihilar opacity. This may be chronic given the calcified pleural plaques, however there are no exams available for comparison and pneumonia could have a similar appearance. Followup PA and lateral chest X-ray is recommended in 3-4 weeks following trial of antibiotic therapy to ensure resolution and exclude underlying malignancy. 2. Hyperinflation and bibasilar atelectasis or scarring.   Electronically Signed   By: Rubye Oaks M.D.   On: 03/19/2015 02:44   Dg Swallowing Func-speech Pathology  03/19/2015   Yehuda Savannah Deblois, CCC-SLP     03/19/2015  2:07 PM  Objective Swallowing Evaluation: Other (Comment) (MBS)  Patient Details  Name: Marco [name] MRN: [id number] Date of Birth: November 17, 1929  Today's Date: 03/19/2015 Time: SLP Start Time (ACUTE ONLY): 1320-SLP Stop Time (ACUTE  ONLY): 1350 SLP Time Calculation (min) (ACUTE ONLY): 30 min  Past Medical History:  Past Medical History  Diagnosis Date  .  COPD (chronic obstructive pulmonary disease)    Past Surgical History: History reviewed. No pertinent past  surgical history. HPI:  Other Pertinent Information: Marco AgeeRobert Boniface is a 81 y.o. male with  Past medical history of COPD, swallowing difficulty.  No Data Recorded  Assessment / Plan / Recommendation CHL IP CLINICAL IMPRESSIONS 03/19/2015  Therapy Diagnosis Suspected primary esophageal dysphagia  Clinical Impression Pt demosntrates oral and ororphayngeal  function WNL for age with no aspiration of any consistency  tested. Pt did have several instances of high flash penetration   (WFL for age) during the swallow when taking consecutive sips,  deeper when taking barium tablet with water. Pt did have hard  coughing throughout test despite clear airway, possibly in  response to appearance of distal esophageal stasis, particularly  with solid trials (no radiologist present to confirm). Pt  recommended to follow basic aspiration and esophageal  precautions. No diet modifications needed though further  assessment of esophageal function may be beneficial (unclear  whether pt was scheduled for MBS or esophagram in [place]).       CHL IP TREATMENT RECOMMENDATION 03/19/2015  Treatment Recommendations No treatment recommended at this time     CHL IP DIET RECOMMENDATION 03/19/2015  SLP Diet Recommendations (No Data)  Liquid Administration via (None)  Medication Administration Whole meds with puree  Compensations Follow solids with liquid;Slow rate;Small  sips/bites  Postural Changes and/or Swallow Maneuvers (None)     CHL IP OTHER RECOMMENDATIONS 03/19/2015  Recommended Consults Consider esophageal assessment  Oral Care Recommendations Patient independent with oral care  Other Recommendations (None)     No flowsheet data found.   No flowsheet data found.   Pertinent Vitals/Pain NA    SLP Swallow Goals No flowsheet data found.  No flowsheet data found.    CHL IP REASON FOR REFERRAL 03/19/2015  Reason for Referral Objectively evaluate swallowing function     CHL IP ORAL PHASE 03/19/2015  Lips (None) Tongue (None)  Mucous membranes (None)  Nutritional status (None)  Other (None)  Oxygen therapy (None)  Oral Phase WFL  Oral - Pudding Teaspoon (None)  Oral - Pudding Cup (None)  Oral - Honey Teaspoon (None)  Oral - Honey Cup (None)  Oral - Honey Syringe (None)  Oral - Nectar Teaspoon (None)  Oral - Nectar Cup (None)  Oral - Nectar Straw (None)  Oral - Nectar Syringe (None)  Oral - Ice Chips (None)  Oral - Thin Teaspoon (None)  Oral - Thin Cup (None)  Oral - Thin Straw (None)  Oral - Thin Syringe (None)  Oral - Puree  (None)  Oral - Mechanical Soft (None)  Oral - Regular (None)  Oral - Multi-consistency (None)  Oral - Pill (None)  Oral Phase - Comment (None)      CHL IP PHARYNGEAL PHASE 03/19/2015  Pharyngeal Phase WFL  Pharyngeal - Pudding Teaspoon (None)  Penetration/Aspiration details (pudding teaspoon) (None)  Pharyngeal - Pudding Cup (None)  Penetration/Aspiration details (pudding cup) (None)  Pharyngeal - Honey Teaspoon (None)  Penetration/Aspiration details (honey teaspoon) (None)  Pharyngeal - Honey Cup (None)  Penetration/Aspiration details (honey cup) (None)  Pharyngeal - Honey Syringe (None)  Penetration/Aspiration details (honey syringe) (None)  Pharyngeal - Nectar Teaspoon (None)  Penetration/Aspiration details (nectar teaspoon) (None)  Pharyngeal - Nectar Cup (None)  Penetration/Aspiration details (nectar cup) (None)  Pharyngeal - Nectar Straw (None)  Penetration/Aspiration details (nectar straw) (None) Pharyngeal -  Nectar Syringe (None)  Penetration/Aspiration details (nectar syringe) (None)  Pharyngeal - Ice Chips (None)  Penetration/Aspiration details (ice chips) (None)  Pharyngeal - Thin Teaspoon (None)  Penetration/Aspiration details (thin teaspoon) (None)  Pharyngeal - Thin Cup (None)  Penetration/Aspiration details (thin cup) (None)  Pharyngeal - Thin Straw (None)  Penetration/Aspiration details (thin straw) (None)  Pharyngeal - Thin Syringe (None)  Penetration/Aspiration details (thin syringe') (None)  Pharyngeal - Puree (None)  Penetration/Aspiration details (puree) (None)  Pharyngeal - Mechanical Soft (None)  Penetration/Aspiration details (mechanical soft) (None)  Pharyngeal - Regular (None)  Penetration/Aspiration details (regular) (None)  Pharyngeal - Multi-consistency (None)  Penetration/Aspiration details (multi-consistency) (None)  Pharyngeal - Pill (None)  Penetration/Aspiration details (pill) (None)  Pharyngeal Comment (None)      CHL IP CERVICAL ESOPHAGEAL PHASE 03/19/2015  Cervical Esophageal  Phase WFL  Pudding Teaspoon (None)  Pudding Cup (None)  Honey Teaspoon (None)  Honey Cup (None)  Honey Straw (None)  Nectar Teaspoon (None)  Nectar Cup (None)  Nectar Straw (None)  Nectar Sippy Cup (None)  Thin Teaspoon (None)  Thin Cup (None)  Thin Straw (None)  Thin Sippy Cup (None)  Cervical Esophageal Comment (None)    No flowsheet data found.        Harlon Ditty, MA CCC-SLP [phone]  Dyanne Iha Riley Nearing 03/19/2015, 2:05 PM     Microbiology: Recent Results (from the past 240 hour(s))  Culture, blood (routine x 2) Call MD if unable to obtain prior to antibiotics being given     Status: None (Preliminary result)   Collection Time: 03/19/15  6:15 AM  Result Value Ref Range Status   Specimen Description BLOOD RIGHT ARM  Final   Special Requests BOTTLES DRAWN AEROBIC ONLY 10CC  Final   Culture   Final           BLOOD CULTURE RECEIVED NO GROWTH TO DATE CULTURE WILL BE HELD FOR 5 DAYS BEFORE ISSUING A FINAL NEGATIVE REPORT Performed at [facility]    Report Status PENDING  Incomplete  Culture, blood (routine x 2) Call MD if unable to obtain prior to antibiotics being given     Status: None (Preliminary result)   Collection Time: 03/19/15  6:24 AM  Result Value Ref Range Status   Specimen Description BLOOD RIGHT HAND  Final   Special Requests BOTTLES DRAWN AEROBIC ONLY 10CC  Final   Culture   Final           BLOOD CULTURE RECEIVED NO GROWTH TO DATE CULTURE WILL BE HELD FOR 5 DAYS BEFORE ISSUING A FINAL NEGATIVE REPORT Performed at [facility]    Report Status PENDING  Incomplete  Culture, sputum-assessment     Status: None   Collection Time: 03/19/15  8:40 AM  Result Value Ref Range Status   Specimen Description SPUTUM  Final   Special Requests NONE  Final   Sputum evaluation   Final    THIS SPECIMEN IS ACCEPTABLE. RESPIRATORY CULTURE REPORT TO FOLLOW.   Report Status 03/19/2015 FINAL  Final  Culture, respiratory (NON-Expectorated)     Status: None (Preliminary  result)   Collection Time: 03/19/15  8:40 AM  Result Value Ref Range Status   Specimen Description SPUTUM  Final   Special Requests NONE  Final   Gram Stain PENDING  Incomplete   Culture   Final    Culture reincubated for better growth Performed at [facility]    Report Status PENDING  Incomplete     Labs: Basic Metabolic Panel:  Recent Labs Lab 03/19/15 0148 03/20/15 0245  NA 135 142  K  3.7 4.4  CL 103 109  CO2 23 25  GLUCOSE 130* 148*  BUN 20 19  CREATININE 0.96 0.92  CALCIUM 8.5* 8.6*   Liver Function Tests: No results for input(s): AST, ALT, ALKPHOS, BILITOT, PROT, ALBUMIN in the last 168 hours. No results for input(s): LIPASE, AMYLASE in the last 168 hours. No results for input(s): AMMONIA in the last 168 hours. CBC:  Recent Labs Lab 03/19/15 0148 03/19/15 0615 03/20/15 0245  WBC 12.2* 16.1* 13.1*  NEUTROABS  --  15.3*  --   HGB 12.5* 12.3* 11.3*  HCT 38.9* 38.1* 35.8*  MCV 87.4 87.6 88.6  PLT 238 231 230   Cardiac Enzymes: No results for input(s): CKTOTAL, CKMB, CKMBINDEX, TROPONINI in the last 168 hours. BNP: BNP (last 3 results) No results for input(s): BNP in the last 8760 hours.  ProBNP (last 3 results) No results for input(s): PROBNP in the last 8760 hours.  CBG: No results for input(s): GLUCAP in the last 168 hours.     Signed:  Chrystian Cupples A  [facility] 03/20/2015, 11:09 AM

## 2015-03-21 LAB — CULTURE, RESPIRATORY

## 2015-03-21 LAB — CULTURE, RESPIRATORY W GRAM STAIN: Culture: NORMAL

## 2015-03-25 LAB — CULTURE, BLOOD (ROUTINE X 2)
CULTURE: NO GROWTH
Culture: NO GROWTH

## 2016-04-25 IMAGING — RF DG SWALLOWING FUNCTION - NRPT [facility]
1 series · 18 of 24 positions shown · non-contrast
Comparison: none

[Series 1: run · 18 acquisitions, 18 frames shown]
[im 1/18]
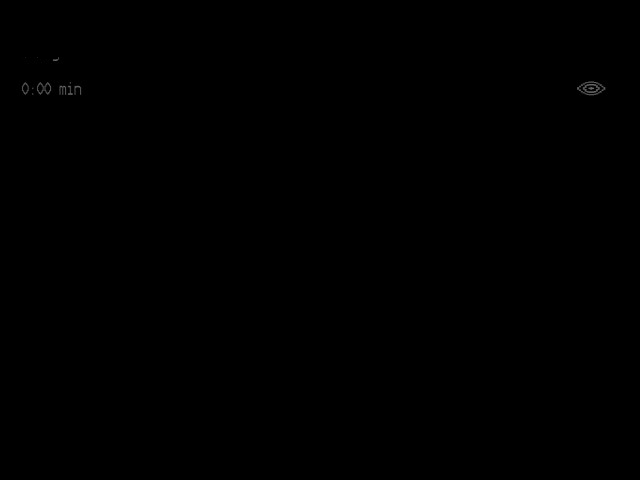
[im 2/18]
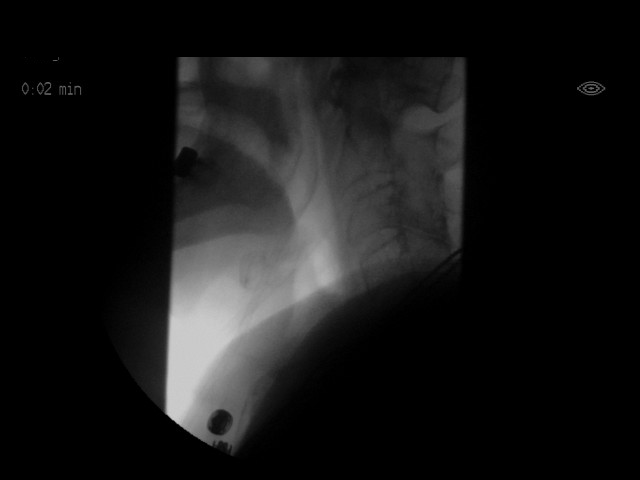
[im 3/18]
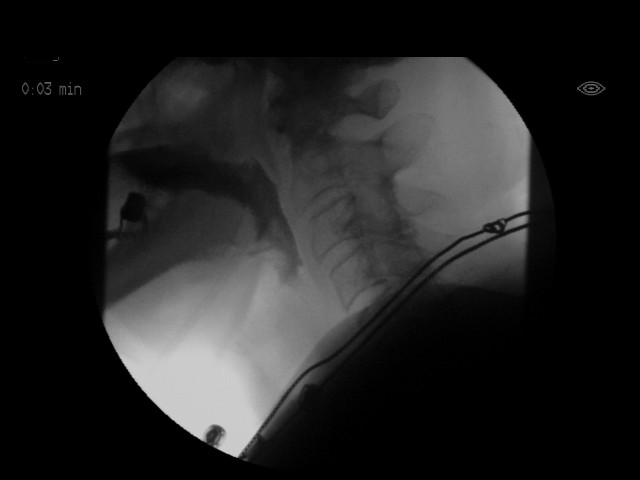
[im 4/18]
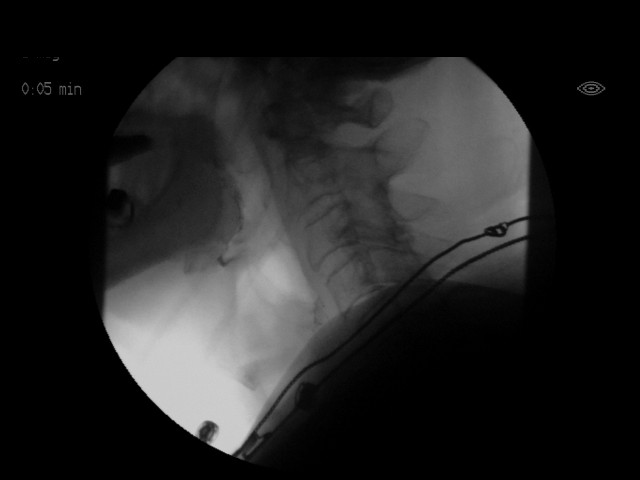
[im 5/18]
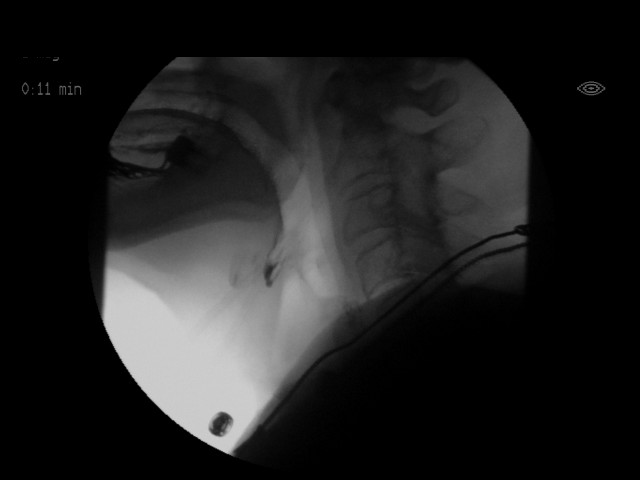
[im 6/18]
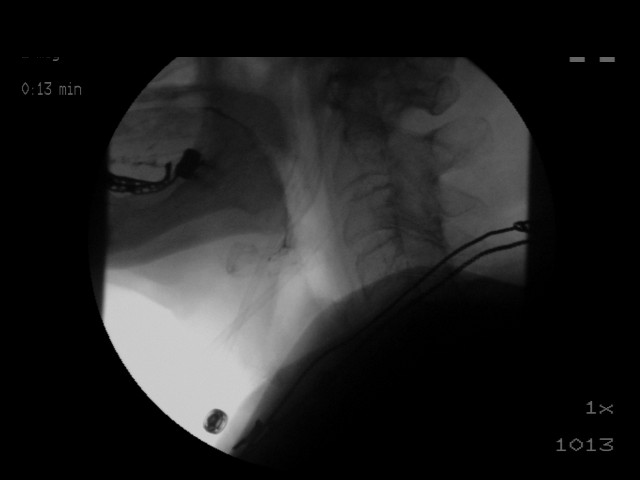
[im 7/18]
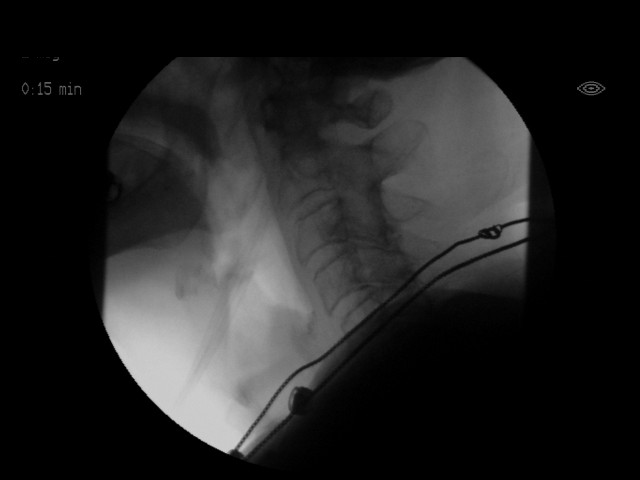
[im 8/18]
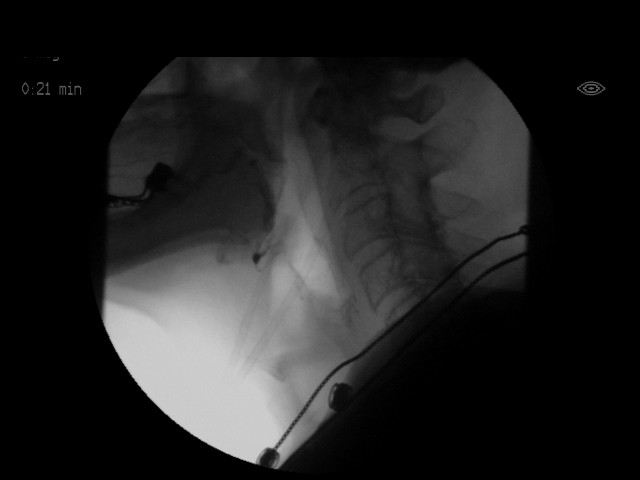
[im 9/18]
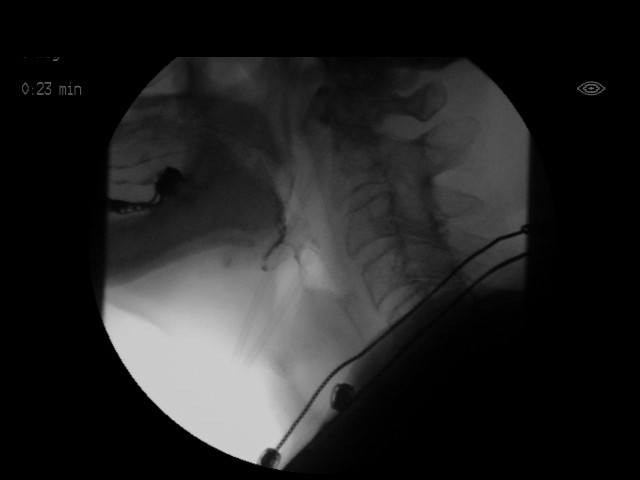
[im 10/18]
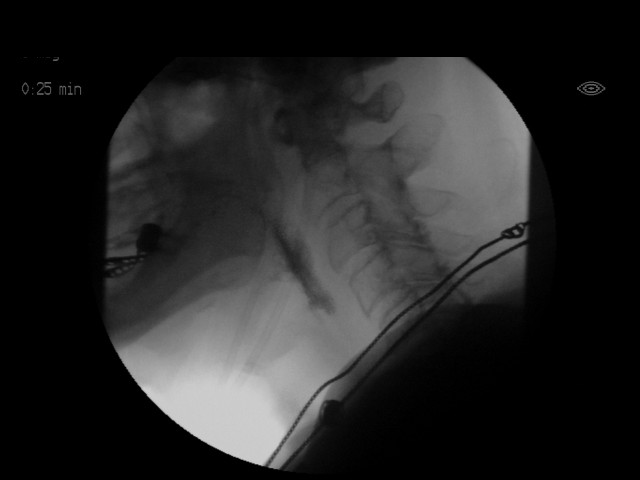
[im 11/18]
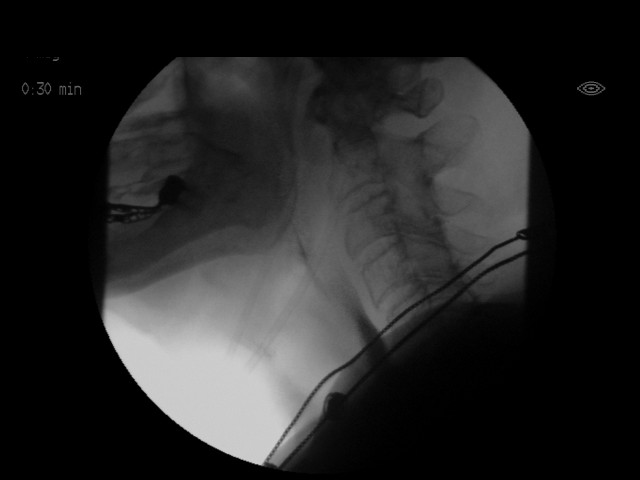
[im 12/18]
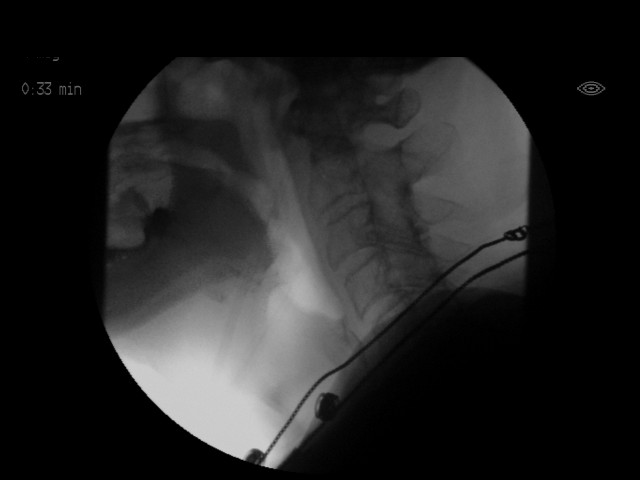
[im 13/18]
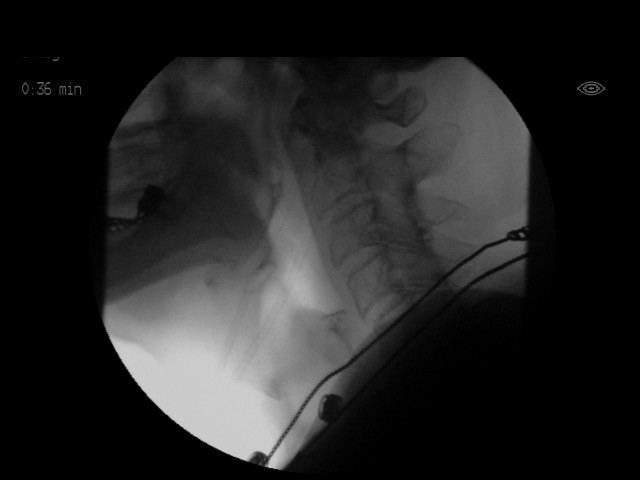
[im 15/18]
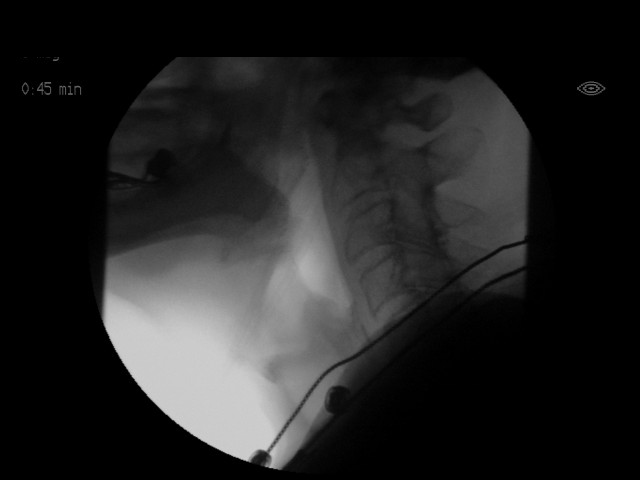
[im 15/18]
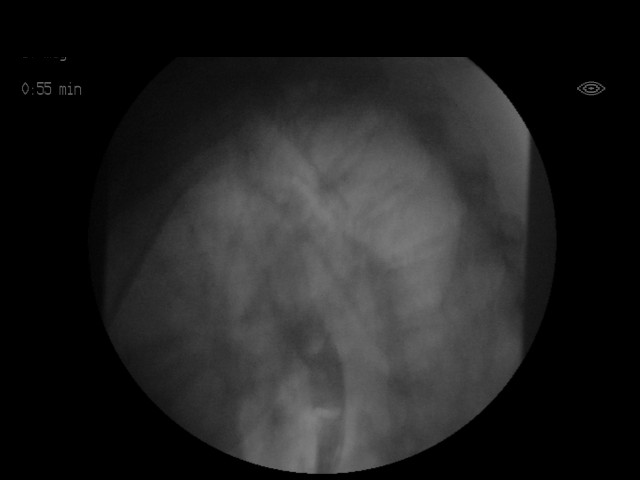
[im 16/18]
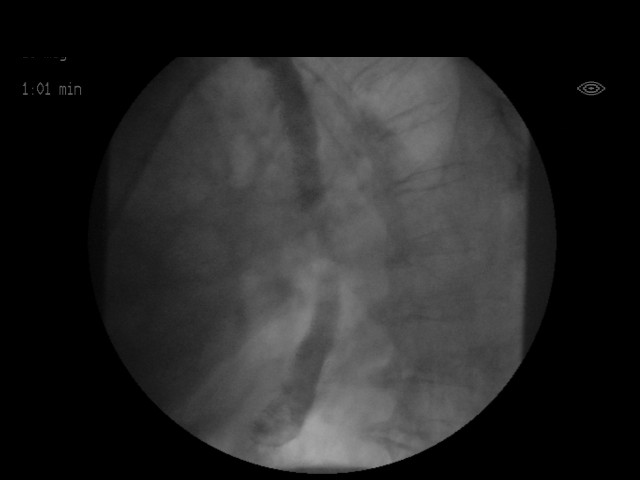
[im 18/18]
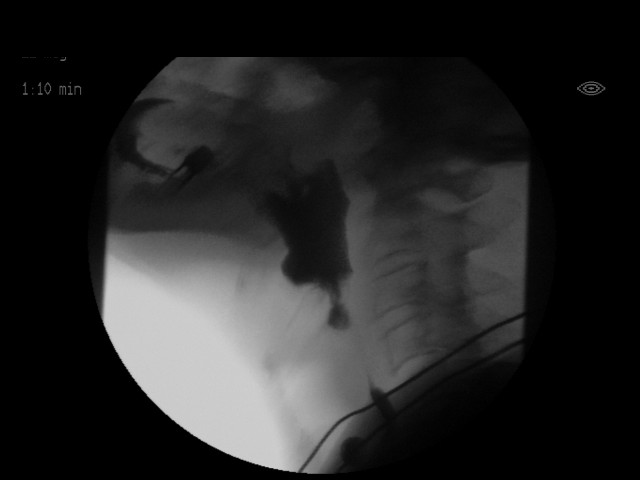
[im 18/18]
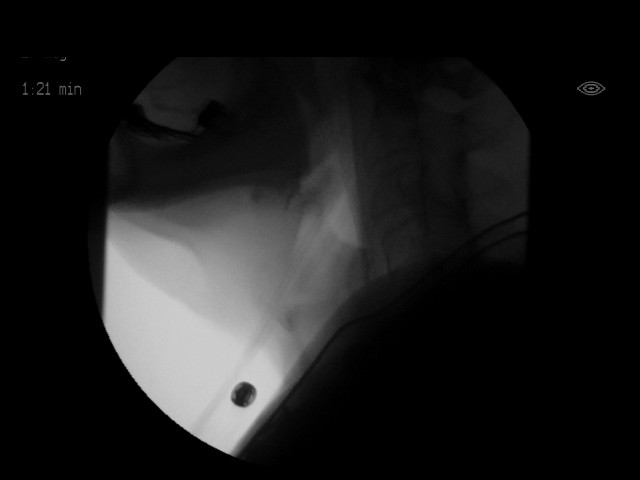

[18 of 24 positions shown; findings below may reference images not displayed]

Canned report from images found in remote index.

Refer to host system for actual result text.

## 2016-04-25 IMAGING — CR DG CHEST 1V PORT
1 series · 1 of 1 positions shown · non-contrast
Comparison: None.

CLINICAL DATA: Cough and shortness of breath for 3-4 weeks.
Left-sided chest pain. History of COPD.

EXAM:
PORTABLE CHEST - 1 VIEW

[AP]
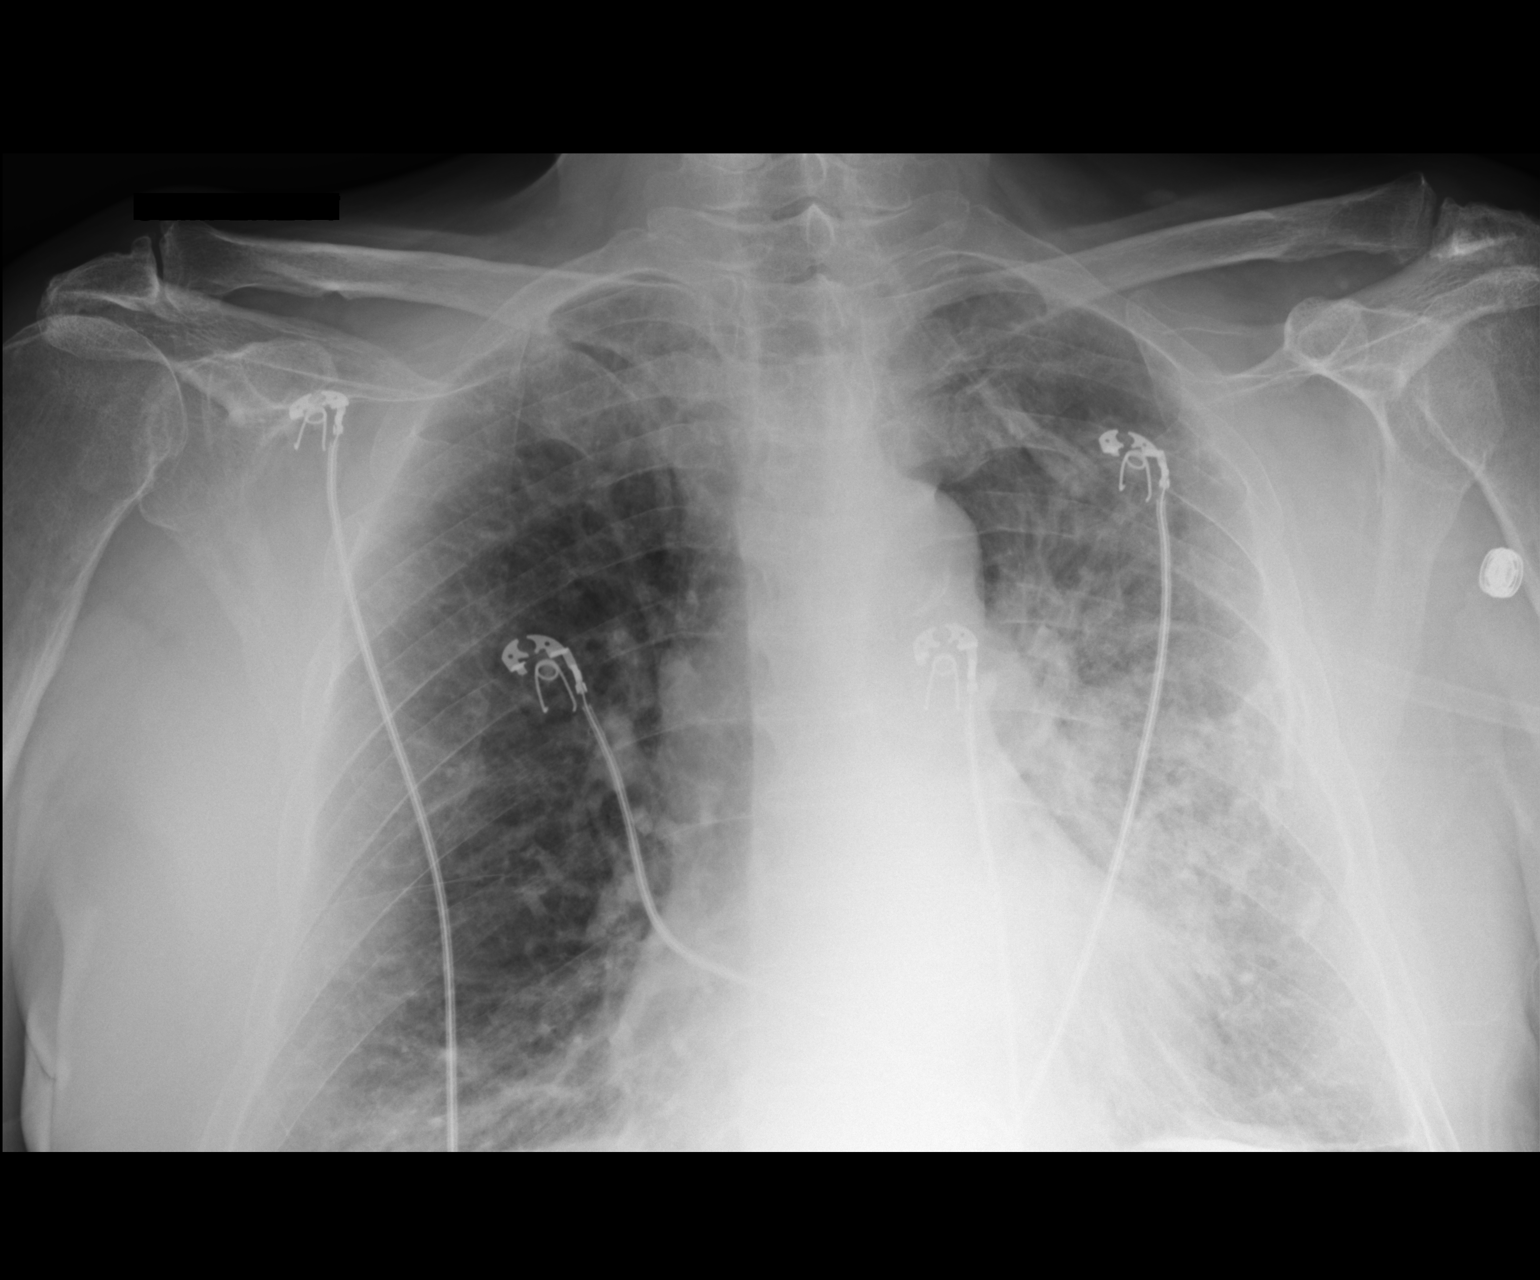

[1 of 1 positions shown; findings below may reference images not displayed]

FINDINGS: Heart size and mediastinal contours are normal. Ill-defined left
perihilar opacity, there are calcified left pleural band
diaphragmatic plaques. The lungs are hyperinflated. Bibasilar
atelectasis or scarring. No large pleural effusion or pneumothorax.
Degenerative change of both shoulders.
IMPRESSION: 1. Ill-defined left perihilar opacity. This may be chronic given the
calcified pleural plaques, however there are no exams available for
comparison and pneumonia could have a similar appearance. Followup
PA and lateral chest X-ray is recommended in 3-4 weeks following
trial of antibiotic therapy to ensure resolution and exclude
underlying malignancy.
2. Hyperinflation and bibasilar atelectasis or scarring.

## 2020-08-03 DEATH — deceased
